# Patient Record
Sex: Male | Born: 1970 | Hispanic: Yes | Marital: Married
Health system: Southern US, Community
[De-identification: ages and names within clinical notes are randomized; demographics above are authoritative.]

---

## 2019-03-21 ENCOUNTER — Emergency Department (HOSPITAL_COMMUNITY): Payer: No Typology Code available for payment source

## 2019-03-21 ENCOUNTER — Emergency Department (HOSPITAL_COMMUNITY)
Admission: EM | Admit: 2019-03-21 | Discharge: 2019-03-21 | Disposition: A | Discharge status: Home / Self Care | Payer: No Typology Code available for payment source | Attending: Emergency Medicine | Admitting: Emergency Medicine

## 2019-03-21 ENCOUNTER — Other Ambulatory Visit: Payer: Self-pay

## 2019-03-21 DIAGNOSIS — M542 Cervicalgia: Secondary | ICD-10-CM | POA: Diagnosis not present

## 2019-03-21 DIAGNOSIS — Y999 Unspecified external cause status: Secondary | ICD-10-CM | POA: Insufficient documentation

## 2019-03-21 DIAGNOSIS — Y93I9 Activity, other involving external motion: Secondary | ICD-10-CM | POA: Insufficient documentation

## 2019-03-21 DIAGNOSIS — Y92410 Unspecified street and highway as the place of occurrence of the external cause: Secondary | ICD-10-CM | POA: Insufficient documentation

## 2019-03-21 MED ORDER — NAPROXEN 500 MG PO TABS: 500.0000 mg | ORAL_TABLET | Freq: Two times a day (BID) | ORAL | 0 refills | Status: AC | PRN

## 2019-03-21 MED ORDER — CYCLOBENZAPRINE HCL 10 MG PO TABS: 10.0000 mg | ORAL_TABLET | Freq: Two times a day (BID) | ORAL | 0 refills | Status: AC | PRN
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
## 2019-03-21 NOTE — ED Notes (Signed)
Pt verbalizes understanding of DC instructions. Pt belongings returned and is ambulatory out of ED.  
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
